# Patient Record
Sex: Male | Born: 1970 | Race: White | Hispanic: No | Marital: Married | Smoking: Never smoker
Health system: Southern US, Community
[De-identification: ages and names within clinical notes are randomized; demographics above are authoritative.]

## PROBLEM LIST (undated history)

## (undated) DIAGNOSIS — K219 Gastro-esophageal reflux disease without esophagitis: Secondary | ICD-10-CM

## (undated) HISTORY — PX: SHOULDER SURGERY: SHX246

## (undated) HISTORY — PX: TONSILLECTOMY: SUR1361

## (undated) HISTORY — PX: NASAL SEPTUM SURGERY: SHX37

## (undated) HISTORY — PX: KNEE SURGERY: SHX244

## (undated) HISTORY — PX: BACK SURGERY: SHX140

---

## 1998-11-10 ENCOUNTER — Encounter: Payer: Self-pay | Admitting: Specialist

## 1998-11-10 ENCOUNTER — Ambulatory Visit (HOSPITAL_COMMUNITY): Admission: RE | Admit: 1998-11-10 | Discharge: 1998-11-10 | Payer: Self-pay | Admitting: Specialist

## 1998-11-24 ENCOUNTER — Ambulatory Visit (HOSPITAL_COMMUNITY): Admission: RE | Admit: 1998-11-24 | Discharge: 1998-11-24 | Payer: Self-pay | Admitting: Specialist

## 1998-11-24 ENCOUNTER — Encounter: Payer: Self-pay | Admitting: Specialist

## 1998-12-08 ENCOUNTER — Encounter: Payer: Self-pay | Admitting: Specialist

## 1998-12-08 ENCOUNTER — Ambulatory Visit (HOSPITAL_COMMUNITY): Admission: RE | Admit: 1998-12-08 | Discharge: 1998-12-08 | Payer: Self-pay | Admitting: Specialist

## 1999-03-16 ENCOUNTER — Encounter: Payer: Self-pay | Admitting: Specialist

## 1999-03-16 ENCOUNTER — Observation Stay (HOSPITAL_COMMUNITY): Admission: RE | Admit: 1999-03-16 | Discharge: 1999-03-17 | Payer: Self-pay | Admitting: Specialist

## 2000-01-23 ENCOUNTER — Emergency Department (HOSPITAL_COMMUNITY): Admission: EM | Admit: 2000-01-23 | Discharge: 2000-01-23 | Payer: Self-pay | Admitting: Internal Medicine

## 2004-08-19 ENCOUNTER — Ambulatory Visit (HOSPITAL_COMMUNITY): Admission: RE | Admit: 2004-08-19 | Discharge: 2004-08-19 | Payer: Self-pay | Admitting: Orthopedic Surgery

## 2011-08-09 ENCOUNTER — Emergency Department (INDEPENDENT_AMBULATORY_CARE_PROVIDER_SITE_OTHER): Payer: 59

## 2011-08-09 ENCOUNTER — Emergency Department (HOSPITAL_BASED_OUTPATIENT_CLINIC_OR_DEPARTMENT_OTHER)
Admission: EM | Admit: 2011-08-09 | Discharge: 2011-08-09 | Disposition: A | Discharge status: Home / Self Care | Payer: 59 | Attending: Emergency Medicine | Admitting: Emergency Medicine

## 2011-08-09 ENCOUNTER — Encounter (HOSPITAL_BASED_OUTPATIENT_CLINIC_OR_DEPARTMENT_OTHER): Payer: Self-pay | Admitting: Emergency Medicine

## 2011-08-09 ENCOUNTER — Other Ambulatory Visit: Payer: Self-pay

## 2011-08-09 DIAGNOSIS — K219 Gastro-esophageal reflux disease without esophagitis: Secondary | ICD-10-CM | POA: Insufficient documentation

## 2011-08-09 DIAGNOSIS — R42 Dizziness and giddiness: Secondary | ICD-10-CM

## 2011-08-09 DIAGNOSIS — H6691 Otitis media, unspecified, right ear: Secondary | ICD-10-CM

## 2011-08-09 DIAGNOSIS — R112 Nausea with vomiting, unspecified: Secondary | ICD-10-CM | POA: Insufficient documentation

## 2011-08-09 DIAGNOSIS — H8309 Labyrinthitis, unspecified ear: Secondary | ICD-10-CM | POA: Insufficient documentation

## 2011-08-09 DIAGNOSIS — R110 Nausea: Secondary | ICD-10-CM

## 2011-08-09 DIAGNOSIS — R1110 Vomiting, unspecified: Secondary | ICD-10-CM

## 2011-08-09 DIAGNOSIS — H6690 Otitis media, unspecified, unspecified ear: Secondary | ICD-10-CM | POA: Insufficient documentation

## 2011-08-09 HISTORY — DX: Gastro-esophageal reflux disease without esophagitis: K21.9

## 2011-08-09 LAB — CBC
HCT: 46 % (ref 39.0–52.0)
Hemoglobin: 16.4 g/dL (ref 13.0–17.0)
MCH: 30.7 pg (ref 26.0–34.0)
MCHC: 35.7 g/dL (ref 30.0–36.0)
MCV: 86 fL (ref 78.0–100.0)
RDW: 12.6 % (ref 11.5–15.5)

## 2011-08-09 LAB — COMPREHENSIVE METABOLIC PANEL
AST: 21 U/L (ref 0–37)
Albumin: 4.2 g/dL (ref 3.5–5.2)
BUN: 24 mg/dL — ABNORMAL HIGH (ref 6–23)
Calcium: 9.5 mg/dL (ref 8.4–10.5)
Chloride: 103 mEq/L (ref 96–112)
Creatinine, Ser: 1.1 mg/dL (ref 0.50–1.35)
Total Bilirubin: 0.6 mg/dL (ref 0.3–1.2)

## 2011-08-09 LAB — DIFFERENTIAL
Basophils Absolute: 0 10*3/uL (ref 0.0–0.1)
Basophils Relative: 0 % (ref 0–1)
Eosinophils Absolute: 0 10*3/uL (ref 0.0–0.7)
Eosinophils Relative: 0 % (ref 0–5)
Monocytes Absolute: 0.8 10*3/uL (ref 0.1–1.0)
Monocytes Relative: 6 % (ref 3–12)

## 2011-08-09 LAB — TROPONIN I: Troponin I: <0.3 ng/mL (ref <0.30)

## 2011-08-09 LAB — APTT: aPTT: 28 seconds (ref 24–37)

## 2011-08-09 MED ORDER — ONDANSETRON 8 MG PO TBDP: ORAL_TABLET | ORAL | Status: AC

## 2011-08-09 MED ORDER — ONDANSETRON HCL 4 MG/2ML IJ SOLN: 4.0000 mg | Freq: Once | INTRAMUSCULAR | Status: DC

## 2011-08-09 MED ORDER — MECLIZINE HCL 25 MG PO TABS: 25.0000 mg | ORAL_TABLET | Freq: Once | ORAL | Status: DC

## 2011-08-09 MED ORDER — DIPHENHYDRAMINE HCL 50 MG/ML IJ SOLN
25.0000 mg | Freq: Once | INTRAMUSCULAR | Status: AC
  Administered 2011-08-09: 25 mg via INTRAVENOUS
  Filled 2011-08-09: qty 1

## 2011-08-09 MED ORDER — ONDANSETRON HCL 4 MG/2ML IJ SOLN
INTRAMUSCULAR | Status: AC
  Administered 2011-08-09: 4 mg
  Filled 2011-08-09: qty 2

## 2011-08-09 MED ORDER — MECLIZINE HCL 50 MG PO TABS: 25.0000 mg | ORAL_TABLET | Freq: Three times a day (TID) | ORAL | Status: AC | PRN

## 2011-08-09 MED ORDER — AMOXICILLIN 500 MG PO CAPS
500.0000 mg | ORAL_CAPSULE | Freq: Once | ORAL | Status: AC
  Administered 2011-08-09: 500 mg via ORAL
  Filled 2011-08-09: qty 1

## 2011-08-09 MED ORDER — SODIUM CHLORIDE 0.9 % IV BOLUS (SEPSIS)
1000.0000 mL | Freq: Once | INTRAVENOUS | Status: AC
  Administered 2011-08-09: 1000 mL via INTRAVENOUS

## 2011-08-09 MED ORDER — AMOXICILLIN 500 MG PO CAPS: 500.0000 mg | ORAL_CAPSULE | Freq: Three times a day (TID) | ORAL | Status: AC

## 2011-08-09 MED ORDER — ONDANSETRON HCL 4 MG/2ML IJ SOLN
INTRAMUSCULAR | Status: AC
  Administered 2011-08-09: 13:00:00
  Filled 2011-08-09: qty 2

## 2011-08-09 MED ORDER — MECLIZINE HCL 25 MG PO TABS
25.0000 mg | ORAL_TABLET | Freq: Once | ORAL | Status: AC
  Administered 2011-08-09: 25 mg via ORAL
  Filled 2011-08-09: qty 1

## 2011-08-09 MED ORDER — LORAZEPAM 2 MG/ML IJ SOLN
1.0000 mg | Freq: Once | INTRAMUSCULAR | Status: AC
  Administered 2011-08-09: 1 mg via INTRAVENOUS
  Filled 2011-08-09: qty 1

## 2011-08-09 NOTE — ED Notes (Signed)
Wife to nse desk-asked if pt can go home with assist and rx meds-EDP [facility] notified and back in again to talk with pt and wife

## 2011-08-09 NOTE — ED Notes (Signed)
Pt was ambulated in hall with EMT escort-dizziness continued-EDP [facility] notified

## 2011-08-09 NOTE — ED Notes (Signed)
Pt returned from MRI via w/c.

## 2011-08-09 NOTE — ED Notes (Signed)
[facility] pt woke with dizziness this am-went to work-dizziness cont'd-vomiting developed-EMS started IV-zofran 4mg  iv given-12 lead WNL-denies pain

## 2011-08-09 NOTE — ED Notes (Signed)
Pt c/o dizziness started 730am while at work-did take a new appetite suppressant this am approx 630am-denies pain-vomiting started 30-40 min PTA

## 2011-08-09 NOTE — ED Provider Notes (Signed)
History     CSN: [id number]  Arrival date & time 08/09/11  1228   First MD Initiated Contact with Patient 08/09/11 1233      Chief Complaint  Patient presents with  . Dizziness    (Consider location/radiation/quality/duration/timing/severity/associated sxs/prior treatment) HPI Patient is a 41 year old male who presents today by EMS from work with chief complaint of dizziness. Patient first noted this this morning around 7:30 AM. Patient has had associated nausea and vomiting. He describes the dizziness as a spinning sensation. He has no history of vertigo or similar symptoms in the past. Patient denies any ringing in his ears. He did have some upper respiratory symptoms last week. He has no history of hypertension or stroke. Patient does have a history of GERD but no history of GI bleeding and he denies blood in his stools or dark tarry stools. Patient has had no fevers, chest pain, shortness of breath,or GU symptoms. Patient has no visual changes, numbness, tingling, or paresthesias. He is hemodynamically stable on presentation. There is some worsening of his symptoms when he turns his head to the left. Patient did receive 500 cc of IV fluid as well as 4 mg of Zofran IV prior to arrival but he continues to feel nauseous. Patient denies any headache with this as well. He does note that he took an appetite suppressant last night also this morning. He had no symptoms at all last night There are no other associated or modifying factors. Past Medical History  Diagnosis Date  . GERD (gastroesophageal reflux disease)     Past Surgical History  Procedure Date  . Shoulder surgery   . Tonsillectomy   . Back surgery   . Nasal septum surgery     History reviewed. No pertinent family history.  History  Substance Use Topics  . Smoking status: Never Smoker   . Smokeless tobacco: Not on file  . Alcohol Use: No      Review of Systems  Constitutional: Negative.   HENT: Negative.   Eyes:  Negative.   Cardiovascular: Negative.   Gastrointestinal: Positive for nausea and vomiting.  Musculoskeletal: Negative.   Neurological: Positive for dizziness.  Hematological: Negative.   Psychiatric/Behavioral: Negative.   All other systems reviewed and are negative.    Allergies  Review of patient's allergies indicates no known allergies.  Home Medications   Current Outpatient Rx  Name Route Sig Dispense Refill  . PRILOSEC PO Oral Take by mouth.      BP 131/80  Pulse 91  Temp(Src) 97.8 F (36.6 C) (Oral)  Resp 16  Ht 5\' 10"  (1.778 m)  Wt 185 lb (83.915 kg)  BMI 26.54 kg/m2  SpO2 99%  Physical Exam  Nursing note and vitals reviewed. GEN: Well-developed, well-nourished male in no distress HEENT: Atraumatic, normocephalic. Oropharynx clear without erythema, patient has right middle ear effusion noted. EYES: PERRLA BL, no scleral icterus. NECK: Trachea midline, no meningismus CV: regular rate and rhythm. No murmurs, rubs, or gallops PULM: No respiratory distress.  No crackles, wheezes, or rales. GI: soft, non-tender. No guarding, rebound, or tenderness. + bowel sounds  GU: deferred Neuro: cranial nerves 2-12 intact, no abnormalities of strength or sensation, A and O x 3, no visual deficits, patient continues have sensation of spinning. MSK: Patient moves all 4 extremities symmetrically, no deformity, edema, or injury noted Skin: No rashes petechiae, purpura, or jaundice Psych: no abnormality of mood   ED Course  Procedures (including critical care time)   Date: 08/09/2011  Rate: 68  Rhythm: normal sinus rhythm  QRS Axis: normal  Intervals: normal  ST/T Wave abnormalities: normal  Conduction Disutrbances: none  Narrative Interpretation: unremarkable     Labs Reviewed  CBC - Abnormal; Notable for the following:    WBC 14.1 (*)    All other components within normal limits  DIFFERENTIAL - Abnormal; Notable for the following:    Neutrophils Relative 87 (*)     Neutro Abs 12.3 (*)    Lymphocytes Relative 7 (*)    All other components within normal limits  COMPREHENSIVE METABOLIC PANEL - Abnormal; Notable for the following:    Glucose, Bld 131 (*)    BUN 24 (*)    GFR calc non Af Amer 81 (*)    All other components within normal limits  PROTIME-INR  APTT  TROPONIN I   Dg Chest 2 View  08/09/2011  *RADIOLOGY REPORT*  Clinical Data: Dizziness, vomiting.  CHEST - 2 VIEW  Comparison: None  Findings: Heart and mediastinal contours are within normal limits. No focal opacities or effusions.  No acute bony abnormality.  IMPRESSION: No active cardiopulmonary disease.  Original Report Authenticated By: Cyndie Chime, M.D.   Ct Head Wo Contrast  08/09/2011  *RADIOLOGY REPORT*  Clinical Data: Dizziness.  CT HEAD WITHOUT CONTRAST  Technique:  Contiguous axial images were obtained from the base of the skull through the vertex without contrast.  Comparison: None.  Findings: No acute intracranial abnormality.  Specifically, no hemorrhage, hydrocephalus, mass lesion, acute infarction, or significant intracranial injury.  No acute calvarial abnormality. Visualized paranasal sinuses and mastoids clear.  Orbital soft tissues unremarkable.  IMPRESSION: No acute intracranial abnormality.  Original Report Authenticated By: Cyndie Chime, M.D.     1. Otitis media of right ear   2. Dizziness       MDM  Patient was evaluated by myself. Patient has taken a new appetite suppressant but reports that his dizziness was present this morning even before that. Patient felt well enough when this began to actually go to work this morning. He has had active vomiting in the ER. This stopped after a second dose of Zofran 4 mg IV. Patient was given IV fluids and also a dose of Benadryl IV and meclizine by mouth. Patient had workup for the possibility of stroke given that this would be the best interest cause of his symptoms and he has never had vertigo before. CT head, chest x-ray,  EKG, troponin, and coags were unremarkable. Patient had a slight leukocytosis with a white count of 14.1. Patient did have evidence of dehydration with a BUN of 24 and creatinine of 1.1. He was feeling better following medications but continued to have spinning sensation. An MRI was performed. Result showed no compromise of the posterior circulation. Reassessment of the patient showed that he no longer had nausea but continued to have spinning sensation even after meclizine 25 mg by mouth. Ativan 1 mg IV was given. Reassessment of the patient indicated a right middle ear effusion. Patient was given amoxicillin 500 mg by mouth for this. At this time patient is pending symptomatic improvement. If he is able to ambulate he will be able to be discharged home. He should be discharged with prescription for meclizine, Ativan, Zofran, and amoxicillin. If he is not resolved patient will likely need admission. Patient will also have followup written for with the ENT if he is discharged home. Care will be signed out to oncoming attending at this time  Cyndra Numbers, MD 08/09/11 [phone]

## 2011-08-09 NOTE — ED Notes (Signed)
Via [facility] -- spoke with Quintan 

## 2011-08-09 NOTE — Discharge Instructions (Signed)

## 2011-08-18 ENCOUNTER — Ambulatory Visit: Payer: 59 | Admitting: Physical Therapy

## 2012-02-05 ENCOUNTER — Emergency Department (HOSPITAL_BASED_OUTPATIENT_CLINIC_OR_DEPARTMENT_OTHER)
Admission: EM | Admit: 2012-02-05 | Discharge: 2012-02-05 | Disposition: A | Discharge status: Home / Self Care | Payer: 59 | Attending: Emergency Medicine | Admitting: Emergency Medicine

## 2012-02-05 ENCOUNTER — Emergency Department (HOSPITAL_BASED_OUTPATIENT_CLINIC_OR_DEPARTMENT_OTHER): Payer: 59

## 2012-02-05 ENCOUNTER — Encounter (HOSPITAL_BASED_OUTPATIENT_CLINIC_OR_DEPARTMENT_OTHER): Payer: Self-pay | Admitting: *Deleted

## 2012-02-05 DIAGNOSIS — S60352A Superficial foreign body of left thumb, initial encounter: Secondary | ICD-10-CM

## 2012-02-05 DIAGNOSIS — Z23 Encounter for immunization: Secondary | ICD-10-CM | POA: Insufficient documentation

## 2012-02-05 DIAGNOSIS — Y939 Activity, unspecified: Secondary | ICD-10-CM | POA: Insufficient documentation

## 2012-02-05 DIAGNOSIS — W268XXA Contact with other sharp object(s), not elsewhere classified, initial encounter: Secondary | ICD-10-CM | POA: Insufficient documentation

## 2012-02-05 DIAGNOSIS — S60459A Superficial foreign body of unspecified finger, initial encounter: Secondary | ICD-10-CM | POA: Insufficient documentation

## 2012-02-05 DIAGNOSIS — K219 Gastro-esophageal reflux disease without esophagitis: Secondary | ICD-10-CM | POA: Insufficient documentation

## 2012-02-05 MED ORDER — LIDOCAINE HCL 2 % IJ SOLN
10.0000 mL | Freq: Once | INTRAMUSCULAR | Status: AC
  Administered 2012-02-05: 200 mg via INTRADERMAL

## 2012-02-05 MED ORDER — LIDOCAINE HCL 2 % IJ SOLN
INTRAMUSCULAR | Status: AC
  Administered 2012-02-05: 200 mg via INTRADERMAL
  Filled 2012-02-05: qty 20

## 2012-02-05 MED ORDER — TETANUS-DIPHTH-ACELL PERTUSSIS 5-2.5-18.5 LF-MCG/0.5 IM SUSP
0.5000 mL | Freq: Once | INTRAMUSCULAR | Status: AC
  Administered 2012-02-05: 0.5 mL via INTRAMUSCULAR
  Filled 2012-02-05: qty 0.5

## 2012-02-05 NOTE — ED Notes (Signed)
Pt d/c home- no rx given-  

## 2012-02-05 NOTE — ED Notes (Signed)
Lawrence Nunez, EDPA at bedside. 

## 2012-02-05 NOTE — ED Notes (Signed)
Fish hook in left thumb

## 2012-02-05 NOTE — ED Notes (Signed)
I completed wound care with bacitracin over wound with 2x2 and kerlix wrap, secured with tape.

## 2012-02-05 NOTE — ED Notes (Signed)
Patient transported to X-ray and returned 

## 2012-02-05 NOTE — ED Provider Notes (Signed)
History     CSN: [id number]  Arrival date & time 02/05/12  1845   First MD Initiated Contact with Patient 02/05/12 2037      Chief Complaint  Patient presents with  . Foreign Body    (Consider location/radiation/quality/duration/timing/severity/associated sxs/prior treatment) Patient is a 41 y.o. male presenting with hand injury. The history is provided by the patient. No language interpreter was used.  Hand Injury  The incident occurred 1 to 2 hours ago. Incident location: fishing. Injury mechanism: a fish hook. The pain is present in the left hand. The quality of the pain is described as aching. The pain is at a severity of 4/10. The pain is mild. The pain has been constant since the incident. The treatment provided moderate relief.    Past Medical History  Diagnosis Date  . GERD (gastroesophageal reflux disease)     Past Surgical History  Procedure Date  . Shoulder surgery   . Tonsillectomy   . Back surgery   . Nasal septum surgery     History reviewed. No pertinent family history.  History  Substance Use Topics  . Smoking status: Never Smoker   . Smokeless tobacco: Not on file  . Alcohol Use: No      Review of Systems  Musculoskeletal: Negative for joint swelling.  All other systems reviewed and are negative.    Allergies  Review of patient's allergies indicates no known allergies.  Home Medications   Current Outpatient Rx  Name Route Sig Dispense Refill  . PRILOSEC PO Oral Take by mouth.      BP 129/89  Pulse 70  Temp 97.7 F (36.5 C) (Oral)  Resp 20  Ht 5\' 10"  (1.778 m)  Wt 185 lb (83.915 kg)  BMI 26.54 kg/m2  SpO2 95%  Physical Exam  Nursing note and vitals reviewed. Constitutional: He is oriented to person, place, and time. He appears well-developed and well-nourished.  HENT:  Head: Normocephalic and atraumatic.  Musculoskeletal: He exhibits tenderness.       Fish hook in distal left thunb,    Neurological: He is alert and oriented  to person, place, and time. He has normal reflexes.  Psychiatric: He has a normal mood and affect.    ED Course  Procedures (including critical care time)  Labs Reviewed - No data to display Dg Finger Thumb Left  02/05/2012  *RADIOLOGY REPORT*  Clinical Data: Fishhook in the thumb.  LEFT THUMB 2+V  Comparison: None.  Findings: There is no radiopaque foreign body.  Presumably the fistula has been removed.  Mild soft tissue swelling is present over the terminal phalanx of the thumb. Alignment of the thumb is anatomic.  IMPRESSION: No retained radiopaque foreign body.  No osseous injury.   Original Report Authenticated By: Andreas Newport, M.D.      1. Foreign body of thumb, left     Lidocaine around broken hook,   I clamped with hemostats,  I turned barb away from nail.   Hook and barb came out when I turned.    MDM  Pt advised soak area,  tetanus        Lonia Skinner [place], [place] 02/05/12 2205

## 2012-02-06 NOTE — ED Provider Notes (Signed)
Medical screening examination/treatment/procedure(s) were performed by non-physician practitioner and as supervising physician I was immediately available for consultation/collaboration.   Carleene Cooper III, MD 02/06/12 1150

## 2017-12-13 ENCOUNTER — Other Ambulatory Visit: Payer: Self-pay

## 2017-12-13 ENCOUNTER — Encounter (HOSPITAL_COMMUNITY): Payer: Self-pay | Admitting: Emergency Medicine

## 2017-12-13 ENCOUNTER — Emergency Department (HOSPITAL_COMMUNITY)
Admission: EM | Admit: 2017-12-13 | Discharge: 2017-12-13 | Disposition: A | Discharge status: Home / Self Care | Payer: 59 | Attending: Emergency Medicine | Admitting: Emergency Medicine

## 2017-12-13 ENCOUNTER — Emergency Department (HOSPITAL_COMMUNITY): Payer: 59

## 2017-12-13 DIAGNOSIS — N201 Calculus of ureter: Secondary | ICD-10-CM | POA: Diagnosis not present

## 2017-12-13 DIAGNOSIS — R1032 Left lower quadrant pain: Secondary | ICD-10-CM | POA: Diagnosis present

## 2017-12-13 LAB — URINALYSIS, ROUTINE W REFLEX MICROSCOPIC
Bacteria, UA: NONE SEEN
Bilirubin Urine: NEGATIVE
Glucose, UA: NEGATIVE mg/dL
KETONES UR: NEGATIVE mg/dL
LEUKOCYTES UA: NEGATIVE
Nitrite: NEGATIVE
PROTEIN: NEGATIVE mg/dL
SPECIFIC GRAVITY, URINE: 1.017 (ref 1.005–1.030)
pH: 6 (ref 5.0–8.0)

## 2017-12-13 MED ORDER — HYDROMORPHONE HCL 1 MG/ML IJ SOLN
1.0000 mg | Freq: Once | INTRAMUSCULAR | Status: AC
  Administered 2017-12-13: 1 mg via INTRAVENOUS
  Filled 2017-12-13: qty 1

## 2017-12-13 MED ORDER — ONDANSETRON HCL 4 MG PO TABS: 4.0000 mg | ORAL_TABLET | Freq: Three times a day (TID) | ORAL | 0 refills | Status: DC | PRN

## 2017-12-13 MED ORDER — KETOROLAC TROMETHAMINE 30 MG/ML IJ SOLN
INTRAMUSCULAR | Status: AC
  Filled 2017-12-13: qty 1

## 2017-12-13 MED ORDER — ONDANSETRON HCL 4 MG/2ML IJ SOLN
4.0000 mg | Freq: Once | INTRAMUSCULAR | Status: AC
  Administered 2017-12-13: 4 mg via INTRAVENOUS
  Filled 2017-12-13: qty 2

## 2017-12-13 MED ORDER — TAMSULOSIN HCL 0.4 MG PO CAPS
0.4000 mg | ORAL_CAPSULE | Freq: Once | ORAL | Status: AC
  Administered 2017-12-13: 0.4 mg via ORAL
  Filled 2017-12-13: qty 1

## 2017-12-13 MED ORDER — KETOROLAC TROMETHAMINE 30 MG/ML IJ SOLN
30.0000 mg | Freq: Once | INTRAMUSCULAR | Status: AC
  Administered 2017-12-13: 30 mg via INTRAVENOUS

## 2017-12-13 MED ORDER — OXYCODONE-ACETAMINOPHEN 5-325 MG PO TABS: 1.0000 | ORAL_TABLET | Freq: Four times a day (QID) | ORAL | 0 refills | Status: DC | PRN

## 2017-12-13 MED ORDER — FENTANYL CITRATE (PF) 100 MCG/2ML IJ SOLN
50.0000 ug | Freq: Once | INTRAMUSCULAR | Status: AC
  Administered 2017-12-13: 50 ug via INTRAVENOUS
  Filled 2017-12-13: qty 2

## 2017-12-13 NOTE — ED Provider Notes (Signed)
[facility] EMERGENCY DEPARTMENT Provider Note   CSN: [id number] Arrival date & time: 12/13/17  5366  Time seen 06:17 AM   History   Chief Complaint Chief Complaint  Patient presents with  . Flank Pain    HPI Lawrence Nunez is a 47 y.o. male.  HPI patient states that 4:30 in the morning he was awakened with pain in his right flank.  He states currently the pain is in his right upper abdomen and sometimes radiates into his back or down into his right mid abdomen.  He has had nausea without vomiting or hematuria.  He has taken no medications.  Nothing makes it feel better or worse.  He denies any prior abdominal surgeries.  He is not a big caffeine drinker.  He states he has a history of kidney stones in the last when he passed was a few months ago.  He states he is never had to have any type of surgical intervention to pass the stones.  He states he is followed at [facility] urology.  PCP Eartha Inch, MD   Past Medical History:  Diagnosis Date  . GERD (gastroesophageal reflux disease)     There are no active problems to display for this patient.   Past Surgical History:  Procedure Laterality Date  . BACK SURGERY    . NASAL SEPTUM SURGERY    . SHOULDER SURGERY    . TONSILLECTOMY          Home Medications    Prior to Admission medications   Medication Sig Start Date End Date Taking? Authorizing Provider  Omeprazole (PRILOSEC PO) Take by mouth.    [provider]  ondansetron (ZOFRAN) 4 MG tablet Take 1 tablet (4 mg total) by mouth every 8 (eight) hours as needed. 12/13/17   Devoria Albe, MD  oxyCODONE-acetaminophen (PERCOCET/ROXICET) 5-325 MG tablet Take 1 tablet by mouth every 6 (six) hours as needed for severe pain. 12/13/17   Devoria Albe, MD    Family History History reviewed. No pertinent family history.  Social History Social History   Tobacco Use  . Smoking status: Never Smoker  . Smokeless tobacco: Current User    Types: Chew  Substance  Use Topics  . Alcohol use: Yes    Alcohol/week: 1.2 oz    Types: 2 Shots of liquor per week  . Drug use: No  lives with spouse   Allergies   Patient has no known allergies.   Review of Systems Review of Systems  All other systems reviewed and are negative.    Physical Exam Updated Vital Signs BP (!) 148/106 (BP Location: Right Arm)   Pulse 79   Temp 98.2 F (36.8 C) (Oral)   Resp (!) 24   Ht 5\' 9"  (1.753 m)   Wt 95.3 kg (210 lb)   SpO2 94%   BMI 31.01 kg/m   Vital signs normal except hypertension   Physical Exam  Constitutional: He is oriented to person, place, and time. He appears well-developed and well-nourished.  Non-toxic appearance. He does not appear ill. He appears distressed.  Pacing around the room holding his right upper quadrant  HENT:  Head: Normocephalic and atraumatic.  Right Ear: External ear normal.  Left Ear: External ear normal.  Nose: Nose normal. No mucosal edema or rhinorrhea.  Mouth/Throat: Oropharynx is clear and moist and mucous membranes are normal. No dental abscesses or uvula swelling.  Eyes: Pupils are equal, round, and reactive to light. Conjunctivae and EOM are normal.  Neck: Normal range of motion and full passive range of motion without pain. Neck supple.  Cardiovascular: Normal rate, regular rhythm and normal heart sounds. Exam reveals no gallop and no friction rub.  No murmur heard. Pulmonary/Chest: Effort normal and breath sounds normal. No respiratory distress. He has no wheezes. He has no rhonchi. He has no rales. He exhibits no tenderness and no crepitus.  Abdominal: Soft. Normal appearance and bowel sounds are normal. He exhibits no distension. There is no tenderness. There is no rebound and no guarding.  Nontender to palpation  Musculoskeletal: Normal range of motion. He exhibits no edema or tenderness.  Moves all extremities well.   Neurological: He is alert and oriented to person, place, and time. He has normal strength.  No cranial nerve deficit.  Skin: Skin is warm, dry and intact. No rash noted. No erythema. No pallor.  Psychiatric: He has a normal mood and affect. His speech is normal and behavior is normal. His mood appears not anxious.  Nursing note and vitals reviewed.    ED Treatments / Results  Labs (all labs ordered are listed, but only abnormal results are displayed) Results for orders placed or performed during the hospital encounter of 12/13/17  Urinalysis, Routine w reflex microscopic  Result Value Ref Range   Color, Urine YELLOW YELLOW   APPearance HAZY (A) CLEAR   Specific Gravity, Urine 1.017 1.005 - 1.030   pH 6.0 5.0 - 8.0   Glucose, UA NEGATIVE NEGATIVE mg/dL   Hgb urine dipstick LARGE (A) NEGATIVE   Bilirubin Urine NEGATIVE NEGATIVE   Ketones, ur NEGATIVE NEGATIVE mg/dL   Protein, ur NEGATIVE NEGATIVE mg/dL   Nitrite NEGATIVE NEGATIVE   Leukocytes, UA NEGATIVE NEGATIVE   RBC / HPF >50 (H) 0 - 5 RBC/hpf   WBC, UA 11-20 0 - 5 WBC/hpf   Bacteria, UA NONE SEEN NONE SEEN   Squamous Epithelial / LPF 0-5 0 - 5   Mucus PRESENT    Budding Yeast PRESENT    Uric Acid Crys, UA PRESENT    Laboratory interpretation all normal except hematuria    EKG None  Radiology Ct Renal Stone Study  Result Date: 12/13/2017 CLINICAL DATA:  Right flank pain EXAM: CT ABDOMEN AND PELVIS WITHOUT CONTRAST TECHNIQUE: Multidetector CT imaging of the abdomen and pelvis was performed following the standard protocol without IV contrast. COMPARISON:  08/08/2017 FINDINGS: Lower chest: No acute abnormality. Hepatobiliary: 13 mm gallstone within the gallbladder. No focal hepatic abnormality. Pancreas: No focal abnormality or ductal dilatation. Spleen: No focal abnormality.  Normal size. Adrenals/Urinary Tract: No adrenal mass. 3 mm proximal right ureteral stone with mild right hydronephrosis. No additional renal or ureteral stones. No adrenal mass. Urinary bladder unremarkable. Stomach/Bowel: Appendix normal.  Stomach, large and small bowel grossly unremarkable. Vascular/Lymphatic: No evidence of aneurysm or adenopathy. Reproductive: No visible focal abnormality. Other: No free fluid or free air. Musculoskeletal: No acute bony abnormality. IMPRESSION: 3 mm proximal right ureteral stone with mild right hydronephrosis. Cholelithiasis. Electronically Signed   By: Charlett Nose M.D.   On: 12/13/2017 07:15    Procedures Procedures (including critical care time)  Medications Ordered in ED Medications  HYDROmorphone (DILAUDID) injection 1 mg (has no administration in time range)  ondansetron (ZOFRAN) injection 4 mg (4 mg Intravenous Given 12/13/17 0630)  ketorolac (TORADOL) 30 MG/ML injection 30 mg (30 mg Intravenous Given 12/13/17 0627)  fentaNYL (SUBLIMAZE) injection 50 mcg (50 mcg Intravenous Given 12/13/17 0721)  tamsulosin (FLOMAX) capsule 0.4 mg (0.4 mg  Oral Given 12/13/17 0759)     Initial Impression / Assessment and Plan / ED Course  I have reviewed the triage vital signs and the nursing notes.  Pertinent labs & imaging results that were available during my care of the patient were reviewed by me and considered in my medical decision making (see chart for details).    Review of patient's prior imaging study shows no prior abdominal CT scans.  She was given Toradol for suspected kidney stone.  Although gallbladder could cause pain in the same area he states the only thing he ate last night was chicken noodle soup.  That would not be likely to precipitate a gallbladder attack.  CT renal was done.  7:20 AM patient returned from CT complaining of still having pain.  He was given fentanyl IV.  We discussed his CT result.  He has figured out he has been seen by Dr. Vernie Ammonsttelin before.  Wife states he still has some Flomax left over from the last time he had a kidney stone.  Pt wanted to start the flomax in the ED.   At time of discharge patient still has some pain, he was given Dilaudid by IV and  discharge  medications for home.  Final Clinical Impressions(s) / ED Diagnoses   Final diagnoses:  Ureterolithiasis  Left ureteral stone    ED Discharge Orders        Ordered    oxyCODONE-acetaminophen (PERCOCET/ROXICET) 5-325 MG tablet  Every 6 hours PRN     12/13/17 0833    ondansetron (ZOFRAN) 4 MG tablet  Every 8 hours PRN     12/13/17 0833      Plan discharge  Devoria AlbeIva Jaquavious Mercer, MD, Concha PyoFACEP    Clifton Kovacic, MD 12/13/17 [phone]

## 2017-12-13 NOTE — Discharge Instructions (Addendum)
Drink plenty of fluids. Take the medications as prescribed.  Take the Flomax once a day until you pass the stone.  Return to the ED if you get fever or have uncontrolled vomiting or pain.  Please call Dr. Margrett Rudttelin's office today.  You can tell them you have a "3 mm proximal ureteral stone on the right".

## 2017-12-13 NOTE — ED Triage Notes (Signed)
Pt states he started having right sided flank pain this morning at 4:30. Pt states pain is sharp, constant. Pt states his last bowel movement was at 4:30, he had gotten up to have bowel movement to relieve the pain, pt states he has some nausea denies vomiting.

## 2019-06-07 IMAGING — CT CT RENAL STONE PROTOCOL
2 of 4 series · 16 of 46 positions shown, 18 images · non-contrast
Comparison: 08/08/2017

CLINICAL DATA: Right flank pain

EXAM:
CT ABDOMEN AND PELVIS WITHOUT CONTRAST
TECHNIQUE: Multidetector CT imaging of the abdomen and pelvis was performed
following the standard protocol without IV contrast.

[Series 2: axial st · axial · 0.85mm/px · z∈[-443,-8]mm · 13 of 97 slices shown, 15 images]
[im 5/97  soft-tissue]
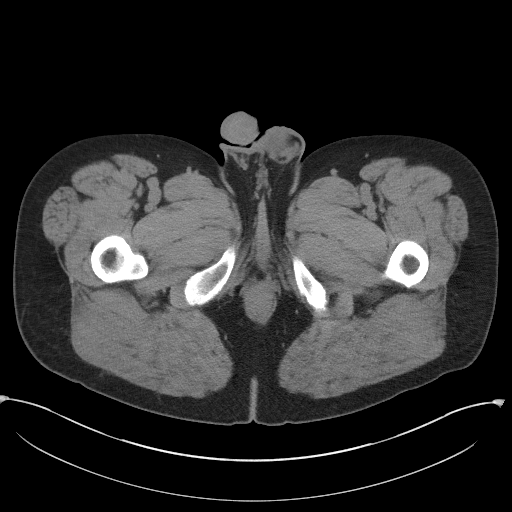
[im 5/97  bone]
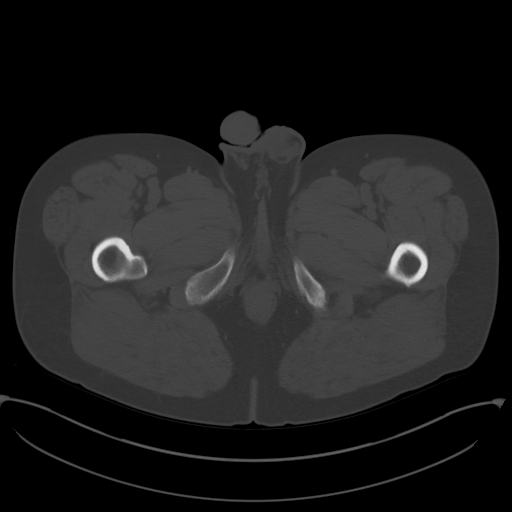
[im 13/97  soft-tissue]
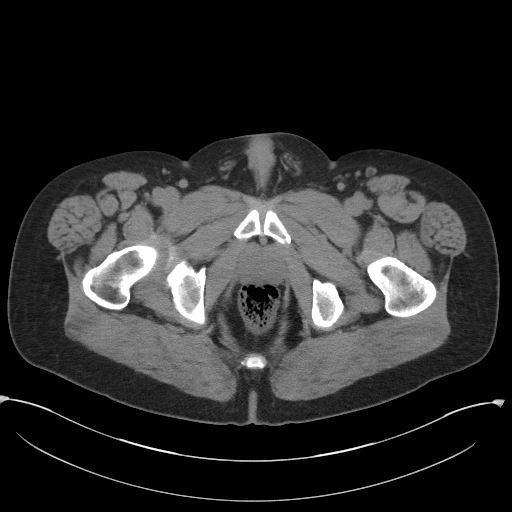
[im 21/97  soft-tissue]
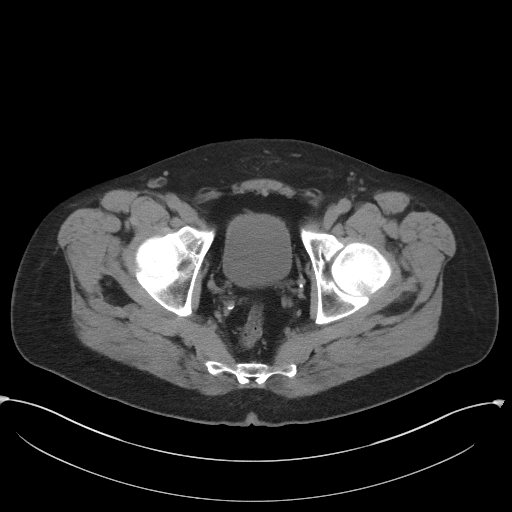
[im 26/97  soft-tissue]
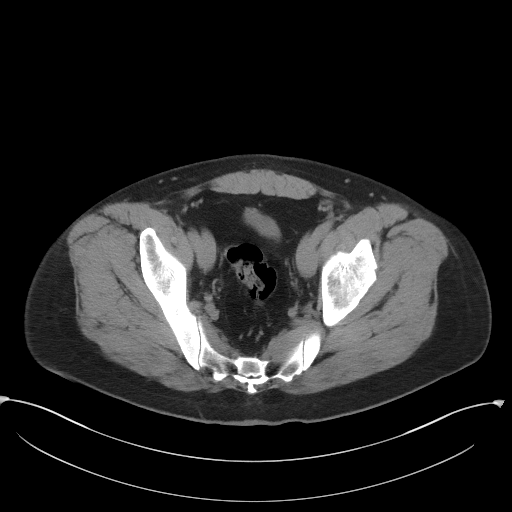
[im 34/97  soft-tissue]
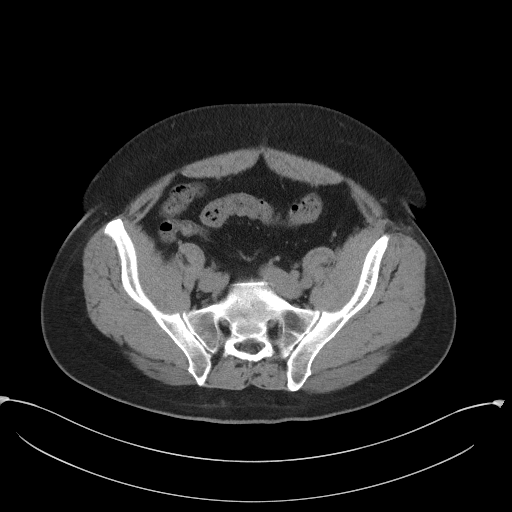
[im 42/97  soft-tissue]
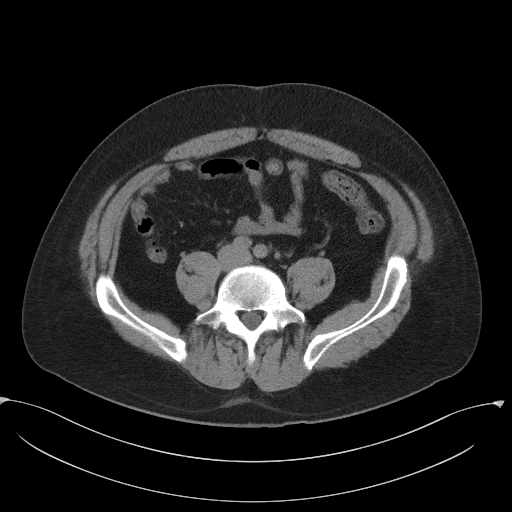
[im 51/97  soft-tissue]
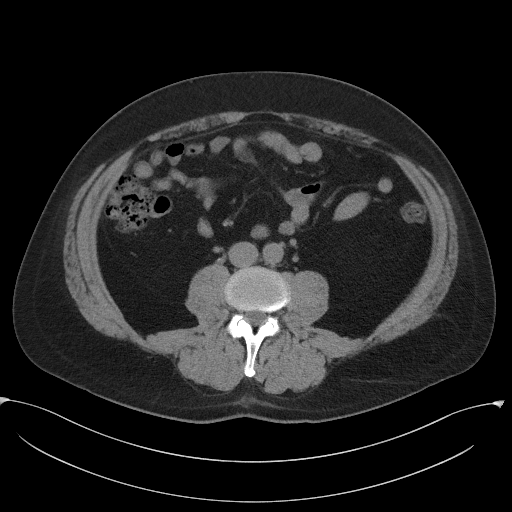
[im 55/97  soft-tissue]
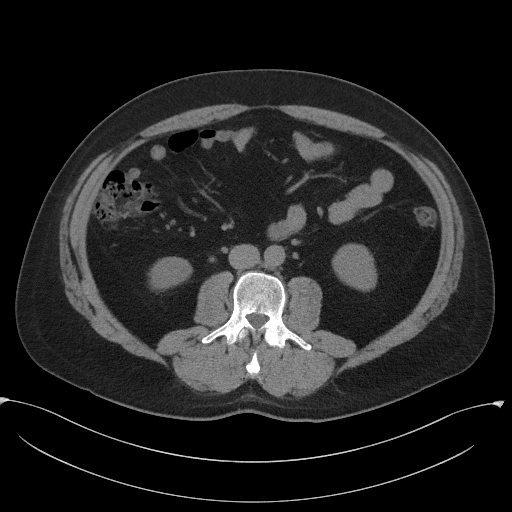
[im 63/97  soft-tissue]
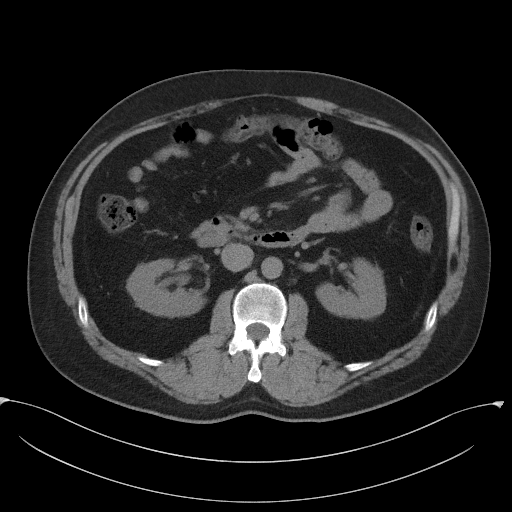
[im 63/97  bone]
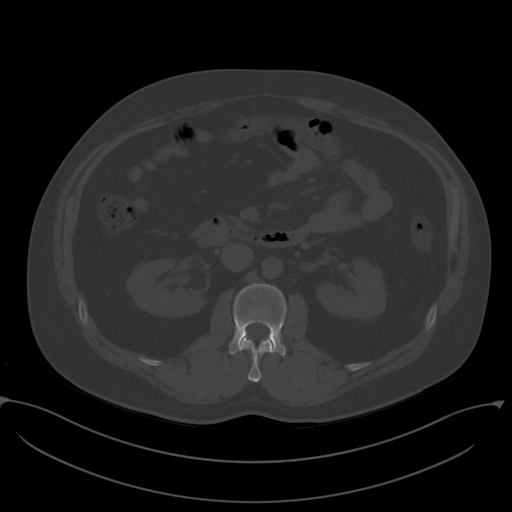
[im 71/97  soft-tissue]
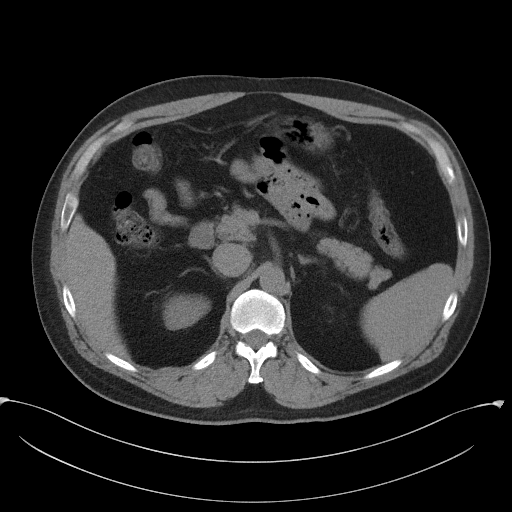
[im 76/97  soft-tissue]
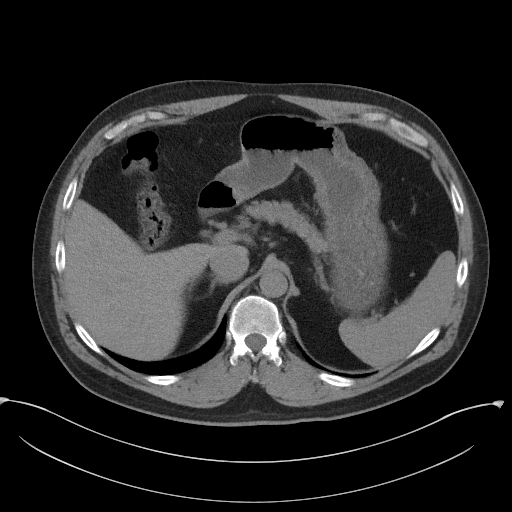
[im 84/97  soft-tissue]
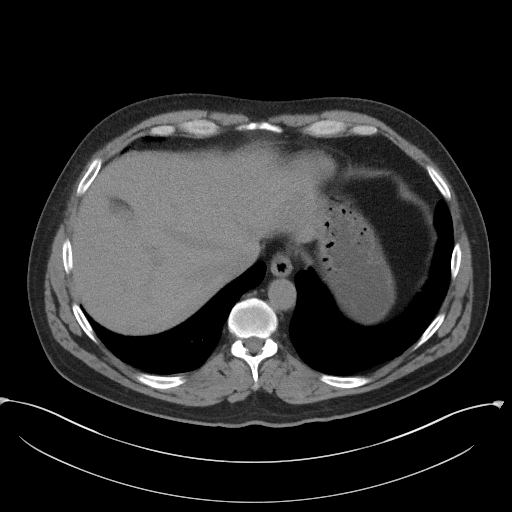
[im 92/97  soft-tissue]
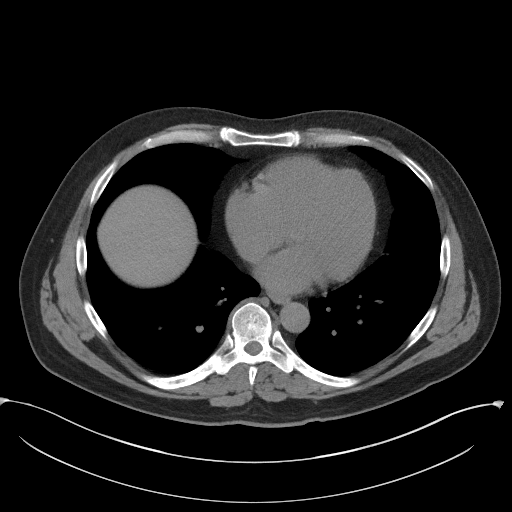

[Series 5: coronal st · coronal · 0.73mm/px · 3 of 101 slices shown]
[im 34/101  soft-tissue]
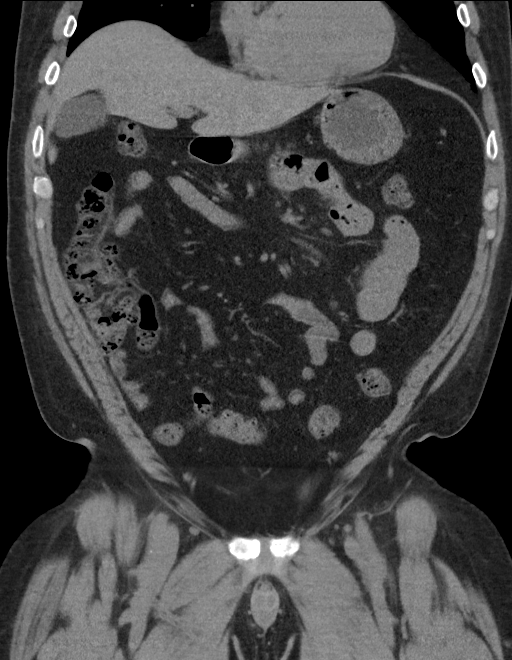
[im 45/101  soft-tissue]
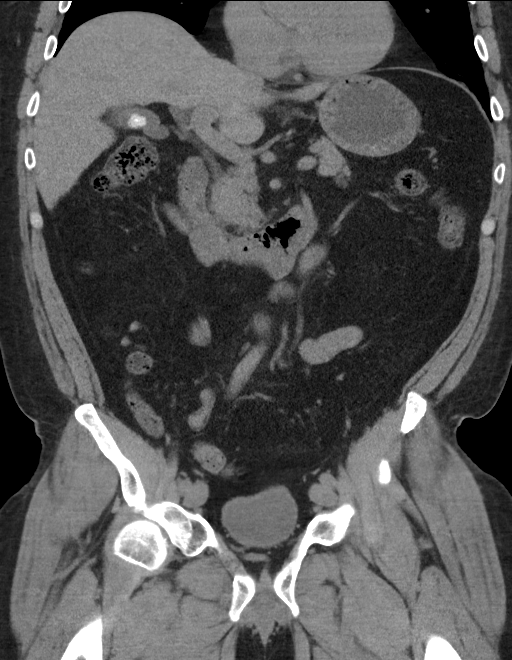
[im 56/101  soft-tissue]
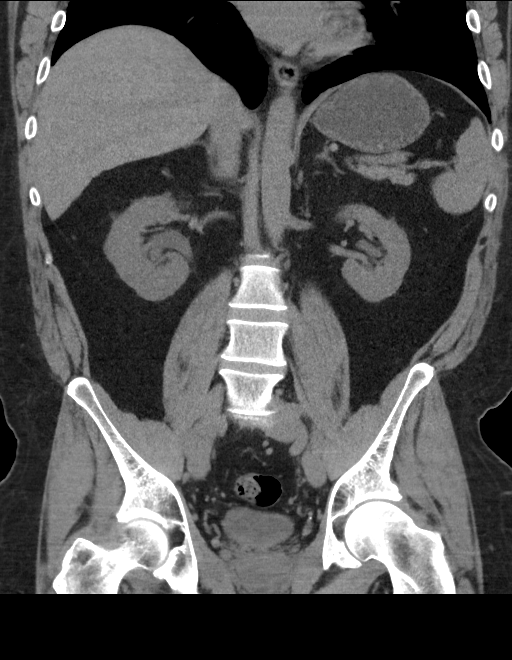

[16 of 46 positions shown; findings below may reference images not displayed]

FINDINGS: Lower chest: No acute abnormality.

Hepatobiliary: 13 mm gallstone within the gallbladder. No focal
hepatic abnormality.

Pancreas: No focal abnormality or ductal dilatation.

Spleen: No focal abnormality.  Normal size.

Adrenals/Urinary Tract: No adrenal mass. 3 mm proximal right
ureteral stone with mild right hydronephrosis. No additional renal
or ureteral stones. No adrenal mass. Urinary bladder unremarkable.

Stomach/Bowel: Appendix normal. Stomach, large and small bowel
grossly unremarkable.

Vascular/Lymphatic: No evidence of aneurysm or adenopathy.

Reproductive: No visible focal abnormality.

Other: No free fluid or free air.

Musculoskeletal: No acute bony abnormality.
IMPRESSION: 3 mm proximal right ureteral stone with mild right hydronephrosis.

Cholelithiasis.

## 2022-10-27 ENCOUNTER — Other Ambulatory Visit: Payer: Self-pay

## 2022-10-27 ENCOUNTER — Encounter (HOSPITAL_BASED_OUTPATIENT_CLINIC_OR_DEPARTMENT_OTHER): Payer: Self-pay | Admitting: Orthopaedic Surgery

## 2022-11-01 NOTE — H&P (Signed)
PREOPERATIVE H&P  Chief Complaint: left shoulder cartilage disorder, impingement syndrome, OA, bicep tendinitis, rotator cuff tear  HPI: [name] is a 52 y.o. male who is scheduled for, Procedure(s): SHOULDER ARTHROSCOPY WITH SUBACROMIAL DECOMPRESSION, ROTATOR CUFF REPAIR AND BICEP TENDON REPAIR OPEN DISTAL CLAVICLE EXCISION.   Patient has a past medical history significant for GERD.   Lawrence Nunez is a 52 year old who works in a physically demanding job. He had issues with his shoulder starting in March and April. He saw another provider and he was told to do physical therapy. An MRI demonstrated a high grade partial thickness subscapularis tear with medialization of the biceps, supraspinatus tear and a concealed infraspinatus tear. He continues to have struggles. He saw Rob Snow,with physical therapy  and he was referred to [place] for further evaluation and management. He continues to be bothered by his shoulder. He cannot sleep well due to his shoulder  Symptoms are rated as moderate to severe, and have been worsening.  This is significantly impairing activities of daily living.    Please see clinic note for further details on this patient's care.    He has elected for surgical management.   Past Medical History:  Diagnosis Date   GERD (gastroesophageal reflux disease)    Past Surgical History:  Procedure Laterality Date   BACK SURGERY     KNEE SURGERY     NASAL SEPTUM SURGERY     SHOULDER SURGERY     TONSILLECTOMY     Social History   Socioeconomic History   Marital status: Married    Spouse name: Not on file   Number of children: Not on file   Years of education: Not on file   Highest education level: Not on file  Occupational History   Not on file  Tobacco Use   Smoking status: Never   Smokeless tobacco: Current    Types: Chew  Substance and Sexual Activity   Alcohol use: Yes    Alcohol/week: 2.0 standard drinks of alcohol    Types: 2 Shots of liquor per  week   Drug use: No   Sexual activity: Yes  Other Topics Concern   Not on file  Social History Narrative   Not on file   Social Determinants of Health   Financial Resource Strain: Not on file  Food Insecurity: Not on file  Transportation Needs: Not on file  Physical Activity: Not on file  Stress: Not on file  Social Connections: Not on file   History reviewed. No pertinent family history. No Known Allergies Prior to Admission medications   Medication Sig Start Date End Date Taking? Authorizing Provider  Omeprazole (PRILOSEC PO) Take by mouth.   Yes [provider]  ondansetron (ZOFRAN) 4 MG tablet Take 1 tablet (4 mg total) by mouth every 8 (eight) hours as needed. 12/13/17   Devoria Albe, MD  oxyCODONE-acetaminophen (PERCOCET/ROXICET) 5-325 MG tablet Take 1 tablet by mouth every 6 (six) hours as needed for severe pain. 12/13/17   Devoria Albe, MD    ROS: All other systems have been reviewed and were otherwise negative with the exception of those mentioned in the HPI and as above.  Physical Exam: General: Alert, no acute distress Cardiovascular: No pedal edema Respiratory: No cyanosis, no use of accessory musculature GI: No organomegaly, abdomen is soft and non-tender Skin: No lesions in the area of chief complaint Neurologic: Sensation intact distally Psychiatric: Patient is competent for consent with normal mood and affect Lymphatic:  No axillary or cervical lymphadenopathy  MUSCULOSKELETAL:  On examination the range of motion of the left shoulder is to 110-120 degrees, passively 160 degrees, external rotation to 60 degrees and painful but symmetric. Cuff strength is weak with supraspinatus, infraspinatus and subscapularis testing. Positive O'Brien's. Positive impingement. Positive AC tenderness to palpation.   Imaging: MRI was reviewed in our system, imported, demonstrating a  high grade upper border subscapularis tear with medialization of the biceps. Significant AC  arthritis. Type II acromion. There is edema  in the distal clavicle. He has a concealed infraspinatus tear and about 50% supraspinatus tear, undersurface partial thickness tear.   Assessment: left shoulder cartilage disorder, impingement syndrome, OA, bicep tendinitis, rotator cuff tear  Plan: Plan for Procedure(s): SHOULDER ARTHROSCOPY WITH SUBACROMIAL DECOMPRESSION, ROTATOR CUFF REPAIR AND BICEP TENDON REPAIR OPEN DISTAL CLAVICLE EXCISION  The risks benefits and alternatives were discussed with the patient including but not limited to the risks of nonoperative treatment, versus surgical intervention including infection, bleeding, nerve injury,  blood clots, cardiopulmonary complications, morbidity, mortality, among others, and they were willing to proceed.   The patient acknowledged the explanation, agreed to proceed with the plan and consent was signed.   Operative Plan: Left shoulder scope with SAD, DCE, BT, RCR Discharge Medications: standard DVT Prophylaxis: none Physical Therapy: outpatient PT Special Discharge needs: Sling. IceMan   Vernetta Honey, PA-C  11/01/2022 9:18 PM

## 2022-11-02 NOTE — Discharge Instructions (Signed)
Ramond Marrow MD, MPH Alfonse Alpers, PA-C [facility] [address] [phone] (tel)   [phone] (fax)   POST-OPERATIVE INSTRUCTIONS - SHOULDER ARTHROSCOPY  WOUND CARE You may remove the Operative Dressing on Post-Op Day #3 (72hrs after surgery).   Alternatively if you would like you can leave dressing on until follow-up if within 7-8 days but keep it dry. Leave steri-strips in place until they fall off on their own, usually 2 weeks postop. There may be a small amount of fluid/bleeding leaking at the surgical site.  This is normal; the shoulder is filled with fluid during the procedure and can leak for 24-48hrs after surgery.  You may change/reinforce the bandage as needed.  Use the Cryocuff or Ice as often as possible for the first 7 days, then as needed for pain relief. Always keep a towel, ACE wrap or other barrier between the cooling unit and your skin.  You may shower on Post-Op Day #3. Gently pat the area dry.  Do not soak the shoulder in water or submerge it.  Keep incisions as dry as possible. Do not go swimming in the pool or ocean until 4 weeks after surgery or when otherwise instructed.    EXERCISES Wear the sling at all times  You may remove the sling for showering, but keep the arm across the chest or in a secondary sling.     It is normal for your fingers/hand to become more swollen after surgery and discolored from bruising.   This will resolve over the first few weeks usually after surgery. Please continue to ambulate and do not stay sitting or lying for too long.  Perform foot and wrist pumps to assist in circulation.  PHYSICAL THERAPY - You will begin physical therapy soon after surgery (unless otherwise specified) - Let our office if there are any issues with scheduling your therapy   - A PT referral was sent to [facility] therapy in [facility] - Please call to set up an appointment, if you do not already have one    REGIONAL ANESTHESIA (NERVE BLOCKS) The anesthesia team may have performed a nerve block for you this is a great tool used to minimize pain.   The block may start wearing off overnight (between 8-24 hours postop) When the block wears off, your pain may go from nearly zero to the pain you would have had postop without the block. This is an abrupt transition but nothing dangerous is happening.   This can be a challenging period but utilize your as needed pain medications to try and manage this period. We suggest you use the pain medication the first night prior to going to bed, to ease this transition.  You may take an extra dose of narcotic when this happens if needed  POST-OP MEDICATIONS- Multimodal approach to pain control In general your pain will be controlled with a combination of substances.  Prescriptions unless otherwise discussed are electronically sent to your pharmacy.  This is a carefully made plan we use to minimize narcotic use.     Celebrex - Anti-inflammatory medication taken on a scheduled basis Acetaminophen - Non-narcotic pain medicine taken on a scheduled basis  Gabapentin - this is to help with nerve based pain, take on a scheduled basis Oxycodone - This is a strong narcotic, to be used only on an "as needed" basis for SEVERE pain. Zofran - take as needed for nausea   FOLLOW-UP If you develop a Fever (?101.5),  Redness or Drainage from the surgical incision site, please call our office to arrange for an evaluation. Please call the office to schedule a follow-up appointment for your first post-operative appointment, 7-10 days post-operatively.    HELPFUL INFORMATION   You may be more comfortable sleeping in a semi-seated position the first few nights following surgery.  Keep a pillow propped under the elbow and forearm for comfort.  If you have a recliner type of chair it might be beneficial.  If not that is fine too, but it would be helpful to sleep propped up with  pillows behind your operated shoulder as well under your elbow and forearm.  This will reduce pulling on the suture lines.  When dressing, put your operative arm in the sleeve first.  When getting undressed, take your operative arm out last.  Loose fitting, button-down shirts are recommended.  Often in the first days after surgery you may be more comfortable keeping your operative arm under your shirt and not through the sleeve.  You may return to work/school in the next couple of days when you feel up to it.  Desk work and typing in the sling is fine.  We suggest you use the pain medication the first night prior to going to bed, in order to ease any pain when the anesthesia wears off. You should avoid taking pain medications on an empty stomach as it will make you nauseous.  You should wean off your narcotic medicines as soon as you are able.  Most patients will be off narcotics before their first postop appointment.   Do not drink alcoholic beverages or take illicit drugs when taking pain medications.  It is against the law to drive while taking narcotics.  In some states it is against the law to drive while your arm is in a sling.   Pain medication may make you constipated.  Below are a few solutions to try in this order: Decrease the amount of pain medication if you aren't having pain. Drink lots of decaffeinated fluids. Drink prune juice and/or eat dried prunes  If the first 3 don't work start with additional solutions Take Colace - an over-the-counter stool softener Take Senokot - an over-the-counter laxative Take Miralax - a stronger over-the-counter laxative  For more information including helpful videos and documents visit our website:   [email or web address]    Information for Discharge Teaching: EXPAREL (bupivacaine liposome injectable suspension)   Your surgeon or anesthesiologist gave you EXPAREL(bupivacaine) to help control your pain after  surgery.  EXPAREL is a local anesthetic that provides pain relief by numbing the tissue around the surgical site. EXPAREL is designed to release pain medication over time and can control pain for up to 72 hours. Depending on how you respond to EXPAREL, you may require less pain medication during your recovery.  Possible side effects: Temporary loss of sensation or ability to move in the area where bupivacaine was injected. Nausea, vomiting, constipation Rarely, numbness and tingling in your mouth or lips, lightheadedness, or anxiety may occur. Call your doctor right away if you think you may be experiencing any of these sensations, or if you have other questions regarding possible side effects.  Follow all other discharge instructions given to you by your surgeon or nurse. Eat a healthy diet and drink plenty of water or other fluids.  If you return to the hospital for any reason within 96 hours following the administration of EXPAREL, it is important for health care providers to know that  you have received this anesthetic. A teal colored band has been placed on your arm with the date, time and amount of EXPAREL you have received in order to alert and inform your health care providers. Please leave this armband in place for the full 96 hours following administration, and then you may remove the band.  Regional Anesthesia Blocks  1. Numbness or the inability to move the "blocked" extremity may last from 3-48 hours after placement. The length of time depends on the medication injected and your individual response to the medication. If the numbness is not going away after 48 hours, call your surgeon.  2. The extremity that is blocked will need to be protected until the numbness is gone and the  Strength has returned. Because you cannot feel it, you will need to take extra care to avoid injury. Because it may be weak, you may have difficulty moving it or using it. You may not know what position it is  in without looking at it while the block is in effect.  3. For blocks in the legs and feet, returning to weight bearing and walking needs to be done carefully. You will need to wait until the numbness is entirely gone and the strength has returned. You should be able to move your leg and foot normally before you try and bear weight or walk. You will need someone to be with you when you first try to ensure you do not fall and possibly risk injury.  4. Bruising and tenderness at the needle site are common side effects and will resolve in a few days.  5. Persistent numbness or new problems with movement should be communicated to the surgeon or the [facility] [phone] [facility] [phone]).   Post Anesthesia Home Care Instructions  Activity: Get plenty of rest for the remainder of the day. A responsible individual must stay with you for 24 hours following the procedure.  For the next 24 hours, DO NOT: -Drive a car -Advertising copywriter -Drink alcoholic beverages -Take any medication unless instructed by your physician -Make any legal decisions or sign important papers.  Meals: Start with liquid foods such as gelatin or soup. Progress to regular foods as tolerated. Avoid greasy, spicy, heavy foods. If nausea and/or vomiting occur, drink only clear liquids until the nausea and/or vomiting subsides. Call your physician if vomiting continues.  Special Instructions/Symptoms: Your throat may feel dry or sore from the anesthesia or the breathing tube placed in your throat during surgery. If this causes discomfort, gargle with warm salt water. The discomfort should disappear within 24 hours.    Next dose of Tylenol can be taken at 3pm today if needed.

## 2022-11-02 NOTE — Anesthesia Preprocedure Evaluation (Signed)
Anesthesia Evaluation  Patient identified by MRN, date of birth, ID band Patient awake    Reviewed: Allergy & Precautions, H&P , NPO status , Patient's Chart, lab work & pertinent test results  Airway Mallampati: II  TM Distance: >3 FB Neck ROM: Full    Dental no notable dental hx. (+) Teeth Intact, Dental Advisory Given   Pulmonary neg pulmonary ROS   Pulmonary exam normal breath sounds clear to auscultation       Cardiovascular Exercise Tolerance: Good negative cardio ROS Normal cardiovascular exam Rhythm:Regular Rate:Normal     Neuro/Psych negative neurological ROS  negative psych ROS   GI/Hepatic negative GI ROS, Neg liver ROS,GERD  Medicated,,  Endo/Other  negative endocrine ROS    Renal/GU negative Renal ROS  negative genitourinary   Musculoskeletal negative musculoskeletal ROS (+)    Abdominal   Peds negative pediatric ROS (+)  Hematology negative hematology ROS (+)   Anesthesia Other Findings   Reproductive/Obstetrics negative OB ROS                             Anesthesia Physical Anesthesia Plan  ASA: 2  Anesthesia Plan: General and Regional   Post-op Pain Management: Regional block*   Induction:   PONV Risk Score and Plan: 2 and Ondansetron, Dexamethasone and Treatment may vary due to age or medical condition  Airway Management Planned: Oral ETT  Additional Equipment: None  Intra-op Plan:   Post-operative Plan: Extubation in OR  Informed Consent: I have reviewed the patients History and Physical, chart, labs and discussed the procedure including the risks, benefits and alternatives for the proposed anesthesia with the patient or authorized representative who has indicated his/her understanding and acceptance.     Dental advisory given  Plan Discussed with: Anesthesiologist and CRNA  Anesthesia Plan Comments: (Discussed both nerve block for pain relief  post-op and GA; including NV, sore throat, dental injury, and pulmonary complications)        Anesthesia Quick Evaluation

## 2022-11-03 ENCOUNTER — Other Ambulatory Visit: Payer: Self-pay

## 2022-11-03 ENCOUNTER — Ambulatory Visit (HOSPITAL_BASED_OUTPATIENT_CLINIC_OR_DEPARTMENT_OTHER)
Admission: RE | Admit: 2022-11-03 | Discharge: 2022-11-03 | Disposition: A | Discharge status: Home / Self Care | Payer: BC Managed Care – PPO | Attending: Orthopaedic Surgery | Admitting: Orthopaedic Surgery

## 2022-11-03 ENCOUNTER — Ambulatory Visit (HOSPITAL_BASED_OUTPATIENT_CLINIC_OR_DEPARTMENT_OTHER): Payer: BC Managed Care – PPO | Admitting: Anesthesiology

## 2022-11-03 ENCOUNTER — Encounter (HOSPITAL_BASED_OUTPATIENT_CLINIC_OR_DEPARTMENT_OTHER): Payer: Self-pay | Admitting: Orthopaedic Surgery

## 2022-11-03 ENCOUNTER — Encounter (HOSPITAL_BASED_OUTPATIENT_CLINIC_OR_DEPARTMENT_OTHER)
Admission: RE | Disposition: A | Discharge status: Home / Self Care | Payer: Self-pay | Source: Home / Self Care | Attending: Orthopaedic Surgery

## 2022-11-03 DIAGNOSIS — M7522 Bicipital tendinitis, left shoulder: Secondary | ICD-10-CM | POA: Insufficient documentation

## 2022-11-03 DIAGNOSIS — K219 Gastro-esophageal reflux disease without esophagitis: Secondary | ICD-10-CM | POA: Insufficient documentation

## 2022-11-03 DIAGNOSIS — S43432A Superior glenoid labrum lesion of left shoulder, initial encounter: Secondary | ICD-10-CM | POA: Insufficient documentation

## 2022-11-03 DIAGNOSIS — M7542 Impingement syndrome of left shoulder: Secondary | ICD-10-CM | POA: Insufficient documentation

## 2022-11-03 DIAGNOSIS — X58XXXA Exposure to other specified factors, initial encounter: Secondary | ICD-10-CM | POA: Diagnosis not present

## 2022-11-03 DIAGNOSIS — S46012A Strain of muscle(s) and tendon(s) of the rotator cuff of left shoulder, initial encounter: Secondary | ICD-10-CM | POA: Diagnosis present

## 2022-11-03 DIAGNOSIS — Z01818 Encounter for other preprocedural examination: Secondary | ICD-10-CM

## 2022-11-03 DIAGNOSIS — M19012 Primary osteoarthritis, left shoulder: Secondary | ICD-10-CM | POA: Insufficient documentation

## 2022-11-03 HISTORY — PX: SHOULDER ARTHROSCOPY WITH SUBACROMIAL DECOMPRESSION, ROTATOR CUFF REPAIR AND BICEP TENDON REPAIR: SHX5687

## 2022-11-03 SURGERY — SHOULDER ARTHROSCOPY WITH SUBACROMIAL DECOMPRESSION, ROTATOR CUFF REPAIR AND BICEP TENDON REPAIR
Anesthesia: Regional | Site: Shoulder | Laterality: Left

## 2022-11-03 MED ORDER — CEFAZOLIN SODIUM-DEXTROSE 2-4 GM/100ML-% IV SOLN
2.0000 g | INTRAVENOUS | Status: AC
  Administered 2022-11-03: 2 g via INTRAVENOUS

## 2022-11-03 MED ORDER — MIDAZOLAM HCL 2 MG/2ML IJ SOLN
2.0000 mg | Freq: Once | INTRAMUSCULAR | Status: AC
  Administered 2022-11-03: 2 mg via INTRAVENOUS

## 2022-11-03 MED ORDER — ACETAMINOPHEN 500 MG PO TABS
ORAL_TABLET | ORAL | Status: AC
  Filled 2022-11-03: qty 2

## 2022-11-03 MED ORDER — OXYCODONE HCL 5 MG/5ML PO SOLN: 5.0000 mg | Freq: Once | ORAL | Status: DC | PRN

## 2022-11-03 MED ORDER — GABAPENTIN 300 MG PO CAPS
ORAL_CAPSULE | ORAL | Status: AC
  Filled 2022-11-03: qty 1

## 2022-11-03 MED ORDER — GABAPENTIN 100 MG PO CAPS: 100.0000 mg | ORAL_CAPSULE | Freq: Three times a day (TID) | ORAL | 0 refills | Status: AC

## 2022-11-03 MED ORDER — FENTANYL CITRATE (PF) 100 MCG/2ML IJ SOLN
25.0000 ug | INTRAMUSCULAR | Status: DC | PRN
  Administered 2022-11-03 (×2): 25 ug via INTRAVENOUS

## 2022-11-03 MED ORDER — TRANEXAMIC ACID 1000 MG/10ML IV SOLN: INTRAVENOUS | Status: DC | PRN

## 2022-11-03 MED ORDER — ROCURONIUM BROMIDE 10 MG/ML (PF) SYRINGE
PREFILLED_SYRINGE | INTRAVENOUS | Status: AC
  Filled 2022-11-03: qty 10

## 2022-11-03 MED ORDER — LIDOCAINE HCL (CARDIAC) PF 100 MG/5ML IV SOSY
PREFILLED_SYRINGE | INTRAVENOUS | Status: DC | PRN
  Administered 2022-11-03: 60 mg via INTRAVENOUS

## 2022-11-03 MED ORDER — FENTANYL CITRATE (PF) 100 MCG/2ML IJ SOLN
INTRAMUSCULAR | Status: AC
  Filled 2022-11-03: qty 2

## 2022-11-03 MED ORDER — FENTANYL CITRATE (PF) 100 MCG/2ML IJ SOLN
INTRAMUSCULAR | Status: DC | PRN
  Administered 2022-11-03: 50 ug via INTRAVENOUS

## 2022-11-03 MED ORDER — ACETAMINOPHEN 500 MG PO TABS
1000.0000 mg | ORAL_TABLET | Freq: Once | ORAL | Status: AC
  Administered 2022-11-03: 1000 mg via ORAL

## 2022-11-03 MED ORDER — SUGAMMADEX SODIUM 200 MG/2ML IV SOLN
INTRAVENOUS | Status: DC | PRN
  Administered 2022-11-03: 200 mg via INTRAVENOUS

## 2022-11-03 MED ORDER — PROPOFOL 10 MG/ML IV BOLUS
INTRAVENOUS | Status: AC
  Filled 2022-11-03: qty 20

## 2022-11-03 MED ORDER — BUPIVACAINE LIPOSOME 1.3 % IJ SUSP
INTRAMUSCULAR | Status: DC | PRN
  Administered 2022-11-03: 10 mL via PERINEURAL

## 2022-11-03 MED ORDER — ONDANSETRON HCL 4 MG/2ML IJ SOLN
INTRAMUSCULAR | Status: AC
  Filled 2022-11-03: qty 2

## 2022-11-03 MED ORDER — OXYCODONE HCL 5 MG PO TABS: ORAL_TABLET | ORAL | 0 refills | Status: AC

## 2022-11-03 MED ORDER — TRANEXAMIC ACID-NACL 1000-0.7 MG/100ML-% IV SOLN
INTRAVENOUS | Status: AC
  Filled 2022-11-03: qty 100

## 2022-11-03 MED ORDER — ACETAMINOPHEN 325 MG PO TABS: 325.0000 mg | ORAL_TABLET | ORAL | Status: DC | PRN

## 2022-11-03 MED ORDER — ROCURONIUM BROMIDE 100 MG/10ML IV SOLN
INTRAVENOUS | Status: DC | PRN
  Administered 2022-11-03: 40 mg via INTRAVENOUS

## 2022-11-03 MED ORDER — SODIUM CHLORIDE 0.9 % IR SOLN
Status: DC | PRN
  Administered 2022-11-03: 6000 mL

## 2022-11-03 MED ORDER — ACETAMINOPHEN 500 MG PO TABS: 1000.0000 mg | ORAL_TABLET | Freq: Three times a day (TID) | ORAL | 0 refills | Status: AC

## 2022-11-03 MED ORDER — OXYCODONE HCL 5 MG PO TABS: 5.0000 mg | ORAL_TABLET | Freq: Once | ORAL | Status: DC | PRN

## 2022-11-03 MED ORDER — DEXAMETHASONE SODIUM PHOSPHATE 4 MG/ML IJ SOLN
INTRAMUSCULAR | Status: DC | PRN
  Administered 2022-11-03: 10 mg via INTRAVENOUS

## 2022-11-03 MED ORDER — TRANEXAMIC ACID-NACL 1000-0.7 MG/100ML-% IV SOLN
INTRAVENOUS | Status: DC | PRN
  Administered 2022-11-03: 1000 mg via INTRAVENOUS

## 2022-11-03 MED ORDER — GABAPENTIN 300 MG PO CAPS
300.0000 mg | ORAL_CAPSULE | Freq: Once | ORAL | Status: AC
  Administered 2022-11-03: 300 mg via ORAL

## 2022-11-03 MED ORDER — ONDANSETRON HCL 4 MG/2ML IJ SOLN: 4.0000 mg | Freq: Once | INTRAMUSCULAR | Status: DC | PRN

## 2022-11-03 MED ORDER — FENTANYL CITRATE (PF) 100 MCG/2ML IJ SOLN
100.0000 ug | Freq: Once | INTRAMUSCULAR | Status: AC
  Administered 2022-11-03: 100 ug via INTRAVENOUS

## 2022-11-03 MED ORDER — CELECOXIB 100 MG PO CAPS: 100.0000 mg | ORAL_CAPSULE | Freq: Two times a day (BID) | ORAL | 0 refills | Status: AC

## 2022-11-03 MED ORDER — LACTATED RINGERS IV SOLN: INTRAVENOUS | Status: DC

## 2022-11-03 MED ORDER — MEPERIDINE HCL 25 MG/ML IJ SOLN: 6.2500 mg | INTRAMUSCULAR | Status: DC | PRN

## 2022-11-03 MED ORDER — ONDANSETRON HCL 4 MG PO TABS: 4.0000 mg | ORAL_TABLET | Freq: Three times a day (TID) | ORAL | 0 refills | Status: AC | PRN

## 2022-11-03 MED ORDER — MIDAZOLAM HCL 2 MG/2ML IJ SOLN
INTRAMUSCULAR | Status: AC
  Filled 2022-11-03: qty 2

## 2022-11-03 MED ORDER — CEFAZOLIN SODIUM-DEXTROSE 2-4 GM/100ML-% IV SOLN
INTRAVENOUS | Status: AC
  Filled 2022-11-03: qty 100

## 2022-11-03 MED ORDER — PHENYLEPHRINE HCL (PRESSORS) 10 MG/ML IV SOLN
INTRAVENOUS | Status: DC | PRN
  Administered 2022-11-03: 40 ug via INTRAVENOUS
  Administered 2022-11-03: 240 ug via INTRAVENOUS
  Administered 2022-11-03: 80 ug via INTRAVENOUS
  Administered 2022-11-03 (×2): 40 ug via INTRAVENOUS

## 2022-11-03 MED ORDER — ONDANSETRON HCL 4 MG/2ML IJ SOLN
INTRAMUSCULAR | Status: DC | PRN
  Administered 2022-11-03: 4 mg via INTRAVENOUS

## 2022-11-03 MED ORDER — DEXAMETHASONE SODIUM PHOSPHATE 10 MG/ML IJ SOLN
INTRAMUSCULAR | Status: AC
  Filled 2022-11-03: qty 1

## 2022-11-03 MED ORDER — LIDOCAINE 2% (20 MG/ML) 5 ML SYRINGE
INTRAMUSCULAR | Status: AC
  Filled 2022-11-03: qty 5

## 2022-11-03 MED ORDER — ACETAMINOPHEN 160 MG/5ML PO SOLN: 325.0000 mg | ORAL | Status: DC | PRN

## 2022-11-03 MED ORDER — PROPOFOL 10 MG/ML IV BOLUS
INTRAVENOUS | Status: DC | PRN
  Administered 2022-11-03: 200 mg via INTRAVENOUS

## 2022-11-03 MED ORDER — BUPIVACAINE HCL (PF) 0.5 % IJ SOLN
INTRAMUSCULAR | Status: DC | PRN
  Administered 2022-11-03: 10 mL via PERINEURAL

## 2022-11-03 SURGICAL SUPPLY — 57 items
AID PSTN UNV HD RSTRNT DISP (MISCELLANEOUS) ×2
ANCH SUT 2 FBRTK KNTLS 1.8 (Anchor) ×6 IMPLANT
ANCH SUT 2 SWLK 19.1 CLS EYLT (Anchor) ×2 IMPLANT
ANCH SUT 2.6 FBRSTK 1.7 (Anchor) ×2 IMPLANT
ANCHOR SUT 1.8 FIBERTAK SB KL (Anchor) ×3 IMPLANT
ANCHOR SUT FBRTK 2.6X1.7X2 (Anchor) ×1 IMPLANT
ANCHOR SWIVELOCK BIO 4.75X19.1 (Anchor) ×1 IMPLANT
APL PRP STRL LF DISP 70% ISPRP (MISCELLANEOUS) ×2
BLADE EXCALIBUR 4.0X13 (MISCELLANEOUS) ×2 IMPLANT
BURR OVAL 8 FLU 4.0X13 (MISCELLANEOUS) ×2 IMPLANT
CANNULA 5.75X71 LONG (CANNULA) IMPLANT
CANNULA PASSPORT 5 (CANNULA) IMPLANT
CANNULA PASSPORT BUTTON 10-40 (CANNULA) ×1 IMPLANT
CANNULA TWIST IN 8.25X7CM (CANNULA) IMPLANT
CHLORAPREP W/TINT 26 (MISCELLANEOUS) ×2 IMPLANT
CLSR STERI-STRIP ANTIMIC 1/2X4 (GAUZE/BANDAGES/DRESSINGS) ×2 IMPLANT
COOLER ICEMAN CLASSIC (MISCELLANEOUS) ×2 IMPLANT
DRAPE IMP U-DRAPE 54X76 (DRAPES) ×2 IMPLANT
DRAPE INCISE IOBAN 66X45 STRL (DRAPES) IMPLANT
DRAPE SHOULDER BEACH CHAIR (DRAPES) ×2 IMPLANT
DW OUTFLOW CASSETTE/TUBE SET (MISCELLANEOUS) ×2 IMPLANT
GAUZE PAD ABD 8X10 STRL (GAUZE/BANDAGES/DRESSINGS) ×2 IMPLANT
GAUZE SPONGE 4X4 12PLY STRL (GAUZE/BANDAGES/DRESSINGS) ×2 IMPLANT
GLOVE BIO SURGEON STRL SZ 6.5 (GLOVE) ×2 IMPLANT
GLOVE BIOGEL PI IND STRL 6.5 (GLOVE) ×2 IMPLANT
GLOVE BIOGEL PI IND STRL 8 (GLOVE) ×2 IMPLANT
GLOVE ECLIPSE 8.0 STRL XLNG CF (GLOVE) ×2 IMPLANT
GOWN STRL REUS W/ TWL LRG LVL3 (GOWN DISPOSABLE) ×4 IMPLANT
GOWN STRL REUS W/TWL LRG LVL3 (GOWN DISPOSABLE) ×4
GOWN STRL REUS W/TWL XL LVL3 (GOWN DISPOSABLE) ×2 IMPLANT
KIT STABILIZATION SHOULDER (MISCELLANEOUS) ×2 IMPLANT
KIT STR SPEAR 1.8 FBRTK DISP (KITS) ×1 IMPLANT
LASSO CRESCENT QUICKPASS (SUTURE) IMPLANT
MANIFOLD NEPTUNE II (INSTRUMENTS) ×2 IMPLANT
NDL HD SCORPION MEGA LOADER (NEEDLE) ×1 IMPLANT
NDL SAFETY ECLIP 18X1.5 (MISCELLANEOUS) ×2 IMPLANT
PACK ARTHROSCOPY DSU (CUSTOM PROCEDURE TRAY) ×2 IMPLANT
PACK BASIN DAY SURGERY FS (CUSTOM PROCEDURE TRAY) ×2 IMPLANT
PAD COLD SHLDR WRAP-ON (PAD) ×2 IMPLANT
PORT APPOLLO RF 90DEGREE MULTI (SURGICAL WAND) ×2 IMPLANT
RESTRAINT HEAD UNIVERSAL NS (MISCELLANEOUS) ×2 IMPLANT
SHEET MEDIUM DRAPE 40X70 STRL (DRAPES) ×2 IMPLANT
SLEEVE SCD COMPRESS KNEE MED (STOCKING) ×2 IMPLANT
SLING ARM FOAM STRAP LRG (SOFTGOODS) IMPLANT
SUT FIBERWIRE #2 38 T-5 BLUE (SUTURE)
SUT MNCRL AB 4-0 PS2 18 (SUTURE) ×2 IMPLANT
SUT PDS AB 0 CT 36 (SUTURE) IMPLANT
SUT PDS AB 1 CT 36 (SUTURE) IMPLANT
SUT TIGER TAPE 7 IN WHITE (SUTURE) IMPLANT
SUTURE FIBERWR #2 38 T-5 BLUE (SUTURE) IMPLANT
SUTURE TAPE TIGERLINK 1.3MM BL (SUTURE) IMPLANT
SUTURETAPE TIGERLINK 1.3MM BL (SUTURE)
SYR 5ML LL (SYRINGE) ×2 IMPLANT
TAPE FIBER 2MM 7IN #2 BLUE (SUTURE) IMPLANT
TOWEL GREEN STERILE FF (TOWEL DISPOSABLE) ×4 IMPLANT
TUBE CONNECTING 20X1/4 (TUBING) ×2 IMPLANT
TUBING ARTHROSCOPY IRRIG 16FT (MISCELLANEOUS) ×2 IMPLANT

## 2022-11-03 NOTE — Op Note (Signed)
Orthopaedic Surgery Operative Note (CSN: [id number])  Lawrence Nunez  27-Aug-1970 Date of Surgery: 11/03/2022   DIAGNOSES: Left shoulder, acute on chronic rotator cuff tear, SLAP tear, biceps tendinitis, AC arthritis, and subacromial impingement.  POST-OPERATIVE DIAGNOSIS: same  PROCEDURE: Arthroscopic extensive debridement - 29823 Subdeltoid Bursa, Supraspinatus Tendon, Anterior Labrum, Superior Labrum, Posterior Labrum, and glenoid bone and cartilage, humeral bone Arthroscopic distal clavicle excision - 40981 Arthroscopic subacromial decompression - 19147 Arthroscopic rotator cuff repair - 82956 Arthroscopic biceps tenodesis - 21308   OPERATIVE FINDING: Exam under anesthesia: Normal Articular space:  Significant redness in the anterior posterior capsule, [facility] was thickened Chondral surfaces: Normal Biceps:  SLAP tear and intrasubstance tearing of the biceps itself Subscapularis: Normal Supraspinatus and infraspinatus: Atypical concealed tear of the supra and infraspinatus it was easily probed through from the bursal side though the articular side looked normal.  We able to perform a takedown and a repair of the tendon.  One by one configuration used for double row repair   Post-operative plan: The patient will be non-weightbearing in a sling .  The patient will be discharged home.  DVT prophylaxis not indicated in ambulatory upper extremity patient without known risk factors.   Pain control with PRN pain medication preferring oral medicines.  Follow up plan will be scheduled in approximately 7 days for incision check and XR.  Surgeons:Primary: Bjorn Pippin, MD Assistants:Caroline McBane PA-C Location: [facility] OR ROOM 1 Anesthesia: General with Exparel interscalene block Antibiotics: Ancef 2 g Tourniquet time: None Estimated Blood Loss: Minimal Complications: None Specimens: None Implants: Implant Name Type Inv. Item Serial No. Manufacturer Lot No. LRB No. Used Action  ANCHOR  SUT 1.8 FIBERTAK SB KL - [id number] Anchor ANCHOR SUT 1.8 FIBERTAK SB KL  [employer] [id number] Left 1 Implanted  ANCHOR SUT 1.8 FIBERTAK SB KL - [id number] Anchor ANCHOR SUT 1.8 FIBERTAK SB KL  [employer] [id number] Left 1 Implanted  ANCHOR SUT FBRTK 2.6X1.7X2 - [id number] Anchor ANCHOR SUT FBRTK 2.6X1.7X2  [employer] [id number] Left 1 Implanted  [facility] BIO 4.75X19.1 - [id number] Anchor ANCHOR SWIVELOCK BIO 4.75X19.1  [employer] [id number] Left 1 Implanted  ANCHOR SUT 1.8 FIBERTAK SB KL - [id number] Anchor ANCHOR SUT 1.8 [employer] [id number] Left 1 Implanted    Indications for Surgery:   Lawrence Nunez is a 52 y.o. male with continued shoulder pain refractory to nonoperative measures for extended period of time.    The risks and benefits were explained at length including but not limited to continued pain, cuff failure, biceps tenodesis failure, stiffness, need for further surgery and infection.   Procedure:   Patient was correctly identified in the preoperative holding area and operative site marked.  Patient brought to OR and positioned beachchair on an [place] table ensuring that all bony prominences were padded and the head was in an appropriate location.  Anesthesia was induced and the operative shoulder was prepped and draped in the usual sterile fashion.  Timeout was called preincision.  A standard posterior viewing portal was made after localizing the portal with a spinal needle.  An anterior accessory portal was also made.  After clearing the articular space the camera was positioned in the subacromial space.  Findings above.    Extensive debridement was performed of the anterior interval tissue, labral fraying and the bursa.  Glenoid bone, glenoid cartilage and humeral bone were all debrided as well.  Subacromial decompression: We made a lateral portal with spinal needle  guidance. We then proceeded to debride bursal tissue extensively with a shaver and arthrocare  device. At that point we continued to identify the borders of the acromion and identify the spur. We then carefully preserved the deltoid fascia and used a burr to convert the acromion to a Type 1 flat acromion without issue.  Biceps tenodesis: We marked the tendon and then performed a tenotomy and debridement of the stump in the articular space. We then identified the biceps tendon in its groove suprapec with the arthroscope in the lateral portal taking care to move from lateral to medial to avoid injury to the subscapularis. At that point we unroofed the tendon itself and mobilized it. An accessory anterior portal was made in line with the tendon and we grasped it from the anterior superior portal and worked from the accessory anterior portal. Two Fibertak 1.69mm knotless anchors were placed in the groove and the tendon was secured in a luggage loop style fashion with a pass of the limb of suture through the tendon using a scorpion device to avoid pull-through.  Repair was completed with good tension on the tendon.  Residual stump of the tendon was removed after being resected with a RF ablator.  Distal Clavicle resection:  The scope was placed in the subacromial space from the posterior portal.  A hemostat was placed through the anterior portal and we spread at the [facility] joint.  A burr was then inserted and 10 mm of distal clavicle was resected taking care to avoid damage to the capsule around the joint and avoiding overhanging bone posteriorly.    Rotator cuff repair: We identified the area that was easily probed through the bursal side, we felt that it was more than 50% involved in this intrasubstance tear need to be addressed.  We felt that a double row repair was appropriate.  We took down the tendon using a knife sharply from bone and then debrided unhealthy tissue.  We prepared the bony surface for healing.  We then placed a single fiber tack medially past 3 limbs of suture and brought them over to a  lateral swivel lock 4.75 bio composite placed 8 to 10 mm below the tip of the tuberosity.  The incisions were closed with absorbable monocryl and steri strips.  A sterile dressing was placed along with a sling. The patient was awoken from general anesthesia and taken to the PACU in stable condition without complication.   Alfonse Alpers, PA-C, present and scrubbed throughout the case, critical for completion in a timely fashion, and for retraction, instrumentation, closure.

## 2022-11-03 NOTE — Anesthesia Postprocedure Evaluation (Signed)
Anesthesia Post Note  Patient: Lawrence Nunez  Procedure(s) Performed: SHOULDER ARTHROSCOPY WITH SUBACROMIAL DECOMPRESSION, ROTATOR CUFF REPAIR AND BICEP TENDON REPAIR, WITH DISTAL CLAVICLE EXCISION (Left: Shoulder)     Patient location during evaluation: PACU Anesthesia Type: Regional and General Level of consciousness: awake and alert Pain management: pain level controlled Vital Signs Assessment: post-procedure vital signs reviewed and stable Respiratory status: spontaneous breathing, nonlabored ventilation, respiratory function stable and patient connected to nasal cannula oxygen Cardiovascular status: blood pressure returned to baseline and stable Postop Assessment: no apparent nausea or vomiting Anesthetic complications: no  No notable events documented.  Last Vitals:  Vitals:   11/03/22 1300 11/03/22 1315  BP: (!) 140/97 (!) 135/99  Pulse: 63 66  Resp: 15 16  Temp:  36.6 C  SpO2: 95% 95%    Last Pain:  Vitals:   11/03/22 1315  PainSc: 2                  Gillian Kluever

## 2022-11-03 NOTE — Interval H&P Note (Signed)
All questions answered, patient wants to proceed with procedure. ? ?

## 2022-11-03 NOTE — Anesthesia Procedure Notes (Signed)
Procedure Name: Intubation Date/Time: 11/03/2022 11:07 AM  Performed by: Karen Kitchens, CRNAPre-anesthesia Checklist: Patient identified, Emergency Drugs available, Suction available and Patient being monitored Patient Re-evaluated:Patient Re-evaluated prior to induction Oxygen Delivery Method: Circle system utilized Preoxygenation: Pre-oxygenation with 100% oxygen Induction Type: IV induction Ventilation: Mask ventilation without difficulty Laryngoscope Size: Mac and 4 Grade View: Grade I Tube type: Oral Tube size: 7.5 mm Number of attempts: 1 Airway Equipment and Method: Stylet and Oral airway Placement Confirmation: ETT inserted through vocal cords under direct vision, positive ETCO2, breath sounds checked- equal and bilateral and CO2 detector Secured at: 24 cm Tube secured with: Tape Dental Injury: Teeth and Oropharynx as per pre-operative assessment

## 2022-11-03 NOTE — Transfer of Care (Signed)
Immediate Anesthesia Transfer of Care Note  Patient: DEWEL GAVILANES  Procedure(s) Performed: SHOULDER ARTHROSCOPY WITH SUBACROMIAL DECOMPRESSION, ROTATOR CUFF REPAIR AND BICEP TENDON REPAIR, WITH DISTAL CLAVICLE EXCISION (Left: Shoulder)  Patient Location: PACU  Anesthesia Type:General and Regional  Level of Consciousness: awake, alert , and patient cooperative  Airway & Oxygen Therapy: Patient Spontanous Breathing and Patient connected to face mask oxygen  Post-op Assessment: Report given to RN and Post -op Vital signs reviewed and stable  Post vital signs: Reviewed and stable  Last Vitals:  Vitals Value Taken Time  BP    Temp    Pulse 74 11/03/22 1212  Resp 12 11/03/22 1212  SpO2 99 % 11/03/22 1212  Vitals shown include unvalidated device data.  Last Pain:  Vitals:   11/03/22 0923  PainSc: 0-No pain      Patients Stated Pain Goal: 5 (11/03/22 0923)  Complications: No notable events documented.

## 2022-11-03 NOTE — Anesthesia Procedure Notes (Signed)
Anesthesia Regional Block: Interscalene brachial plexus block   Pre-Anesthetic Checklist: , timeout performed,  Correct Patient, Correct Site, Correct Laterality,  Correct Procedure, Correct Position, site marked,  Risks and benefits discussed,  Surgical consent,  Pre-op evaluation,  At surgeon's request and post-op pain management  Laterality: Left  Prep: chloraprep       Needles:  Injection technique: Single-shot  Needle Type: Echogenic Stimulator Needle     Needle Length: 5cm  Needle Gauge: 22     Additional Needles:   Procedures:, nerve stimulator,,, ultrasound used (permanent image in chart),,     Nerve Stimulator or Paresthesia:  Response: hand, 0.45 mA  Additional Responses:   Narrative:  Start time: 11/03/2022 9:45 AM End time: 11/03/2022 9:50 AM Injection made incrementally with aspirations every 5 mL.  Performed by: Personally  Anesthesiologist: Bethena Midget, MD  Additional Notes: Functioning IV was confirmed and monitors were applied.  A 50mm 22ga Arrow echogenic stimulator needle was used. Sterile prep and drape,hand hygiene and sterile gloves were used. Ultrasound guidance: relevant anatomy identified, needle position confirmed, local anesthetic spread visualized around nerve(s)., vascular puncture avoided.  Image printed for medical record. Negative aspiration and negative test dose prior to incremental administration of local anesthetic. The patient tolerated the procedure well.

## 2022-11-03 NOTE — Progress Notes (Signed)
Assisted Dr. Oddono with left, interscalene , ultrasound guided block. Side rails up, monitors on throughout procedure. See vital signs in flow sheet. Tolerated Procedure well. 

## 2022-11-04 ENCOUNTER — Encounter (HOSPITAL_BASED_OUTPATIENT_CLINIC_OR_DEPARTMENT_OTHER): Payer: Self-pay | Admitting: Orthopaedic Surgery
